# Patient Record
Sex: Female | Born: 2000 | Race: White | Hispanic: No | Marital: Single | Smoking: Never smoker
Health system: Southern US, Community
[De-identification: ages and names within clinical notes are randomized; demographics above are authoritative.]

## PROBLEM LIST (undated history)

## (undated) DIAGNOSIS — F509 Eating disorder, unspecified: Secondary | ICD-10-CM

## (undated) DIAGNOSIS — T50901A Poisoning by unspecified drugs, medicaments and biological substances, accidental (unintentional), initial encounter: Secondary | ICD-10-CM

## (undated) DIAGNOSIS — G43909 Migraine, unspecified, not intractable, without status migrainosus: Secondary | ICD-10-CM

## (undated) HISTORY — PX: WRIST FRACTURE SURGERY: SHX121

---
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
## 2019-01-19 IMAGING — DX DG ABDOMEN 1V
2 series · 2 of 2 positions shown · non-contrast
Comparison: None.

CLINICAL DATA: Acute right-sided low back pain without sciatica.

EXAM:
ABDOMEN - 1 VIEW

[abdomen kub (1 of 2)]
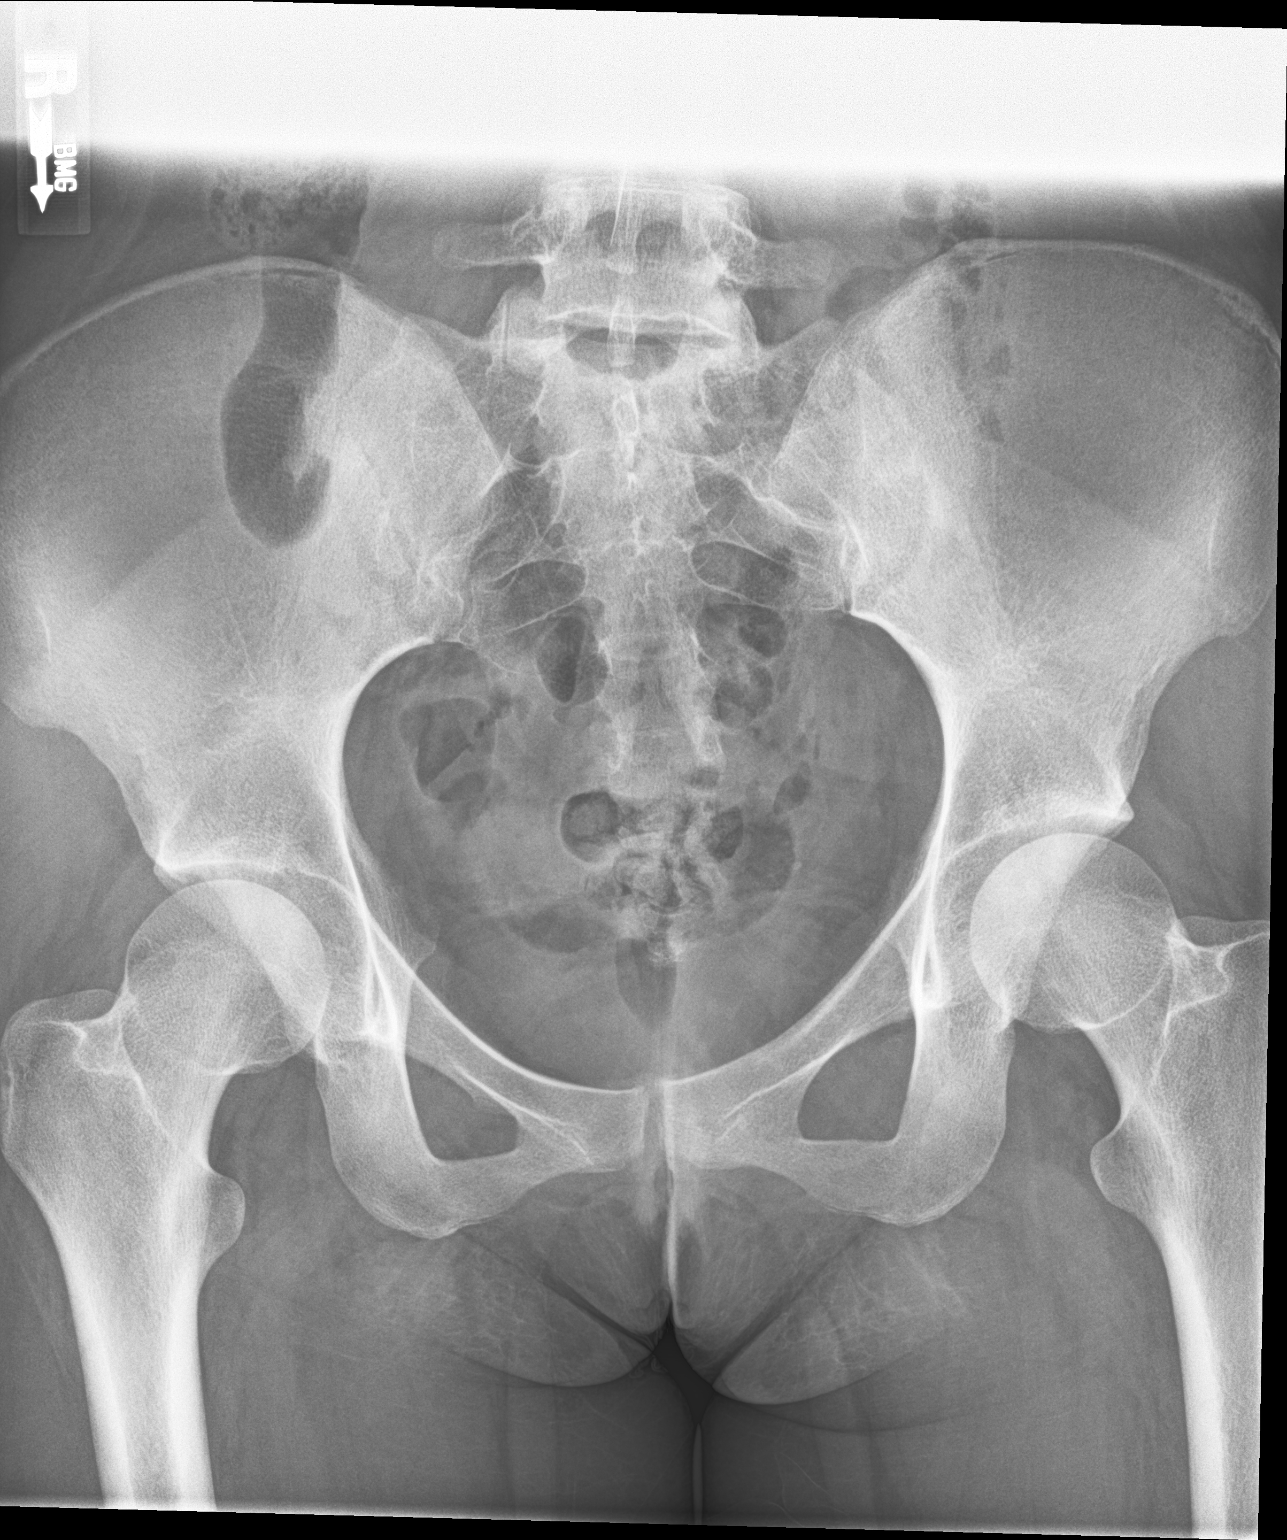

[abdomen kub (2 of 2)]
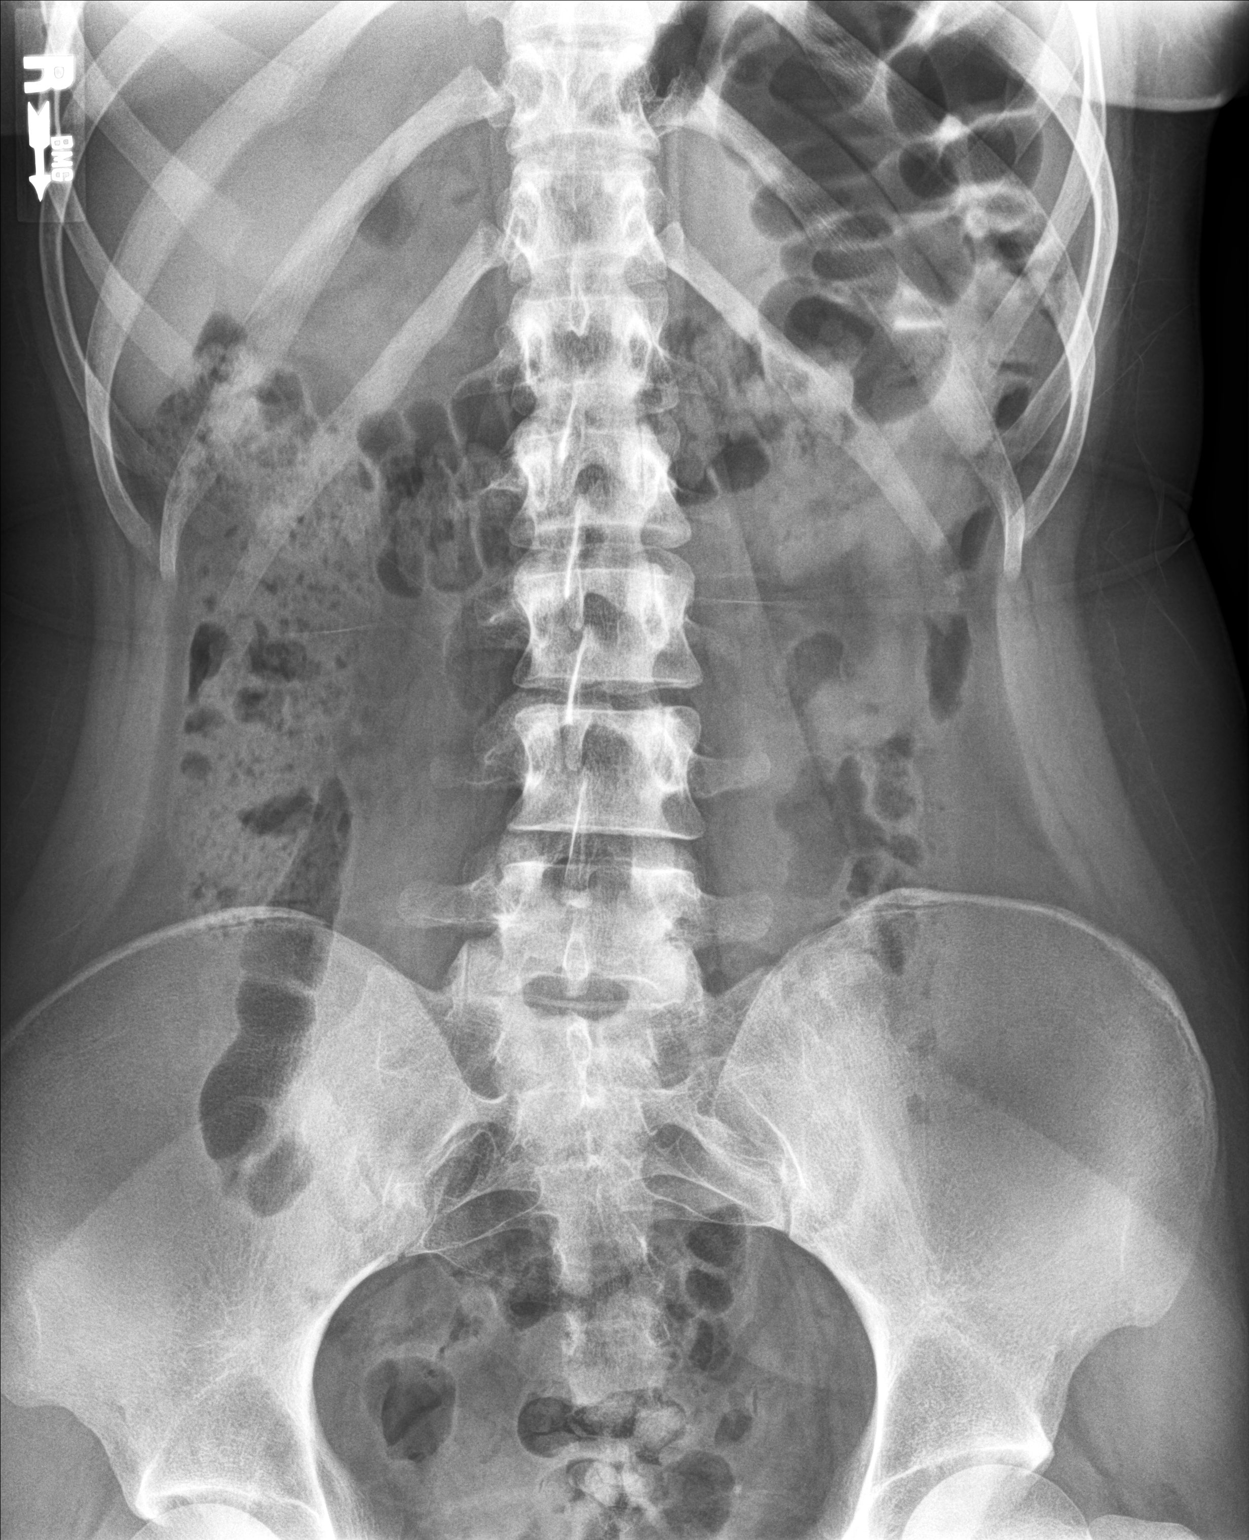

[2 of 2 positions shown; findings below may reference images not displayed]

FINDINGS: The bowel gas pattern is normal. No radio-opaque calculi or other
significant radiographic abnormality are seen.
IMPRESSION: No evidence of bowel obstruction or ileus. No definite evidence of
nephrolithiasis.

## 2019-05-28 DIAGNOSIS — F901 Attention-deficit hyperactivity disorder, predominantly hyperactive type: Secondary | ICD-10-CM | POA: Diagnosis not present

## 2019-05-28 DIAGNOSIS — F319 Bipolar disorder, unspecified: Secondary | ICD-10-CM | POA: Diagnosis not present

## 2019-07-01 DIAGNOSIS — F319 Bipolar disorder, unspecified: Secondary | ICD-10-CM | POA: Diagnosis not present

## 2019-08-12 DIAGNOSIS — F319 Bipolar disorder, unspecified: Secondary | ICD-10-CM | POA: Diagnosis not present

## 2019-08-12 DIAGNOSIS — F902 Attention-deficit hyperactivity disorder, combined type: Secondary | ICD-10-CM | POA: Diagnosis not present

## 2019-09-16 DIAGNOSIS — F902 Attention-deficit hyperactivity disorder, combined type: Secondary | ICD-10-CM | POA: Diagnosis not present

## 2019-09-16 DIAGNOSIS — F319 Bipolar disorder, unspecified: Secondary | ICD-10-CM | POA: Diagnosis not present

## 2019-10-27 DIAGNOSIS — F902 Attention-deficit hyperactivity disorder, combined type: Secondary | ICD-10-CM | POA: Diagnosis not present

## 2019-10-27 DIAGNOSIS — F319 Bipolar disorder, unspecified: Secondary | ICD-10-CM | POA: Diagnosis not present

## 2019-11-04 ENCOUNTER — Emergency Department (HOSPITAL_COMMUNITY)
Admission: EM | Admit: 2019-11-04 | Discharge: 2019-11-04 | Disposition: A | Discharge status: Home / Self Care | Payer: Federal, State, Local not specified - PPO

## 2019-11-04 ENCOUNTER — Other Ambulatory Visit: Payer: Self-pay

## 2019-12-02 DIAGNOSIS — F319 Bipolar disorder, unspecified: Secondary | ICD-10-CM | POA: Diagnosis not present

## 2019-12-02 DIAGNOSIS — F902 Attention-deficit hyperactivity disorder, combined type: Secondary | ICD-10-CM | POA: Diagnosis not present

## 2019-12-16 DIAGNOSIS — F319 Bipolar disorder, unspecified: Secondary | ICD-10-CM | POA: Diagnosis not present

## 2019-12-23 DIAGNOSIS — F319 Bipolar disorder, unspecified: Secondary | ICD-10-CM | POA: Diagnosis not present

## 2019-12-26 DIAGNOSIS — Z20822 Contact with and (suspected) exposure to covid-19: Secondary | ICD-10-CM | POA: Diagnosis not present

## 2019-12-26 DIAGNOSIS — J039 Acute tonsillitis, unspecified: Secondary | ICD-10-CM | POA: Diagnosis not present

## 2019-12-26 DIAGNOSIS — J029 Acute pharyngitis, unspecified: Secondary | ICD-10-CM | POA: Diagnosis not present

## 2019-12-26 DIAGNOSIS — R509 Fever, unspecified: Secondary | ICD-10-CM | POA: Diagnosis not present

## 2020-01-08 DIAGNOSIS — F902 Attention-deficit hyperactivity disorder, combined type: Secondary | ICD-10-CM | POA: Diagnosis not present

## 2020-01-08 DIAGNOSIS — F319 Bipolar disorder, unspecified: Secondary | ICD-10-CM | POA: Diagnosis not present

## 2020-02-26 DIAGNOSIS — F902 Attention-deficit hyperactivity disorder, combined type: Secondary | ICD-10-CM | POA: Diagnosis not present

## 2020-02-26 DIAGNOSIS — F319 Bipolar disorder, unspecified: Secondary | ICD-10-CM | POA: Diagnosis not present

## 2020-03-24 DIAGNOSIS — F319 Bipolar disorder, unspecified: Secondary | ICD-10-CM | POA: Diagnosis not present

## 2020-03-24 DIAGNOSIS — F902 Attention-deficit hyperactivity disorder, combined type: Secondary | ICD-10-CM | POA: Diagnosis not present

## 2020-07-04 ENCOUNTER — Encounter (HOSPITAL_COMMUNITY): Payer: Self-pay

## 2020-07-04 ENCOUNTER — Emergency Department (HOSPITAL_COMMUNITY)
Admission: EM | Admit: 2020-07-04 | Discharge: 2020-07-04 | Disposition: A | Discharge status: Home / Self Care | Payer: Federal, State, Local not specified - PPO | Attending: Emergency Medicine | Admitting: Emergency Medicine

## 2020-07-04 DIAGNOSIS — R197 Diarrhea, unspecified: Secondary | ICD-10-CM | POA: Diagnosis not present

## 2020-07-04 DIAGNOSIS — R509 Fever, unspecified: Secondary | ICD-10-CM | POA: Diagnosis not present

## 2020-07-04 DIAGNOSIS — R109 Unspecified abdominal pain: Secondary | ICD-10-CM | POA: Insufficient documentation

## 2020-07-04 DIAGNOSIS — R112 Nausea with vomiting, unspecified: Secondary | ICD-10-CM | POA: Diagnosis present

## 2020-07-04 DIAGNOSIS — E860 Dehydration: Secondary | ICD-10-CM

## 2020-07-04 LAB — CBC WITH DIFFERENTIAL/PLATELET
Abs Immature Granulocytes: 0.06 10*3/uL (ref 0.00–0.07)
Basophils Absolute: 0 10*3/uL (ref 0.0–0.1)
Basophils Relative: 0 %
Eosinophils Absolute: 0 10*3/uL (ref 0.0–0.5)
Eosinophils Relative: 0 %
HCT: 45.4 % (ref 36.0–46.0)
Hemoglobin: 15.4 g/dL — ABNORMAL HIGH (ref 12.0–15.0)
Immature Granulocytes: 0 %
Lymphocytes Relative: 6 %
Lymphs Abs: 0.8 10*3/uL (ref 0.7–4.0)
MCH: 31.2 pg (ref 26.0–34.0)
MCHC: 33.9 g/dL (ref 30.0–36.0)
MCV: 92.1 fL (ref 80.0–100.0)
Monocytes Absolute: 0.8 10*3/uL (ref 0.1–1.0)
Monocytes Relative: 6 %
Neutro Abs: 12.8 10*3/uL — ABNORMAL HIGH (ref 1.7–7.7)
Neutrophils Relative %: 88 %
Platelets: 321 10*3/uL (ref 150–400)
RBC: 4.93 MIL/uL (ref 3.87–5.11)
RDW: 12 % (ref 11.5–15.5)
WBC: 14.5 10*3/uL — ABNORMAL HIGH (ref 4.0–10.5)
nRBC: 0 % (ref 0.0–0.2)

## 2020-07-04 LAB — COMPREHENSIVE METABOLIC PANEL
ALT: 9 U/L (ref 0–44)
AST: 14 U/L — ABNORMAL LOW (ref 15–41)
Albumin: 5 g/dL (ref 3.5–5.0)
Alkaline Phosphatase: 38 U/L (ref 38–126)
Anion gap: 14 (ref 5–15)
BUN: 17 mg/dL (ref 6–20)
CO2: 20 mmol/L — ABNORMAL LOW (ref 22–32)
Calcium: 10 mg/dL (ref 8.9–10.3)
Chloride: 105 mmol/L (ref 98–111)
Creatinine, Ser: 0.69 mg/dL (ref 0.44–1.00)
GFR, Estimated: >60 mL/min (ref >60)
Glucose, Bld: 101 mg/dL — ABNORMAL HIGH (ref 70–99)
Potassium: 3.5 mmol/L (ref 3.5–5.1)
Sodium: 139 mmol/L (ref 135–145)
Total Bilirubin: 1.2 mg/dL (ref 0.3–1.2)
Total Protein: 8.1 g/dL (ref 6.5–8.1)

## 2020-07-04 LAB — URINALYSIS, ROUTINE W REFLEX MICROSCOPIC
Bilirubin Urine: NEGATIVE
Glucose, UA: NEGATIVE mg/dL
Hgb urine dipstick: NEGATIVE
Ketones, ur: 80 mg/dL — AB
Leukocytes,Ua: NEGATIVE
Nitrite: NEGATIVE
Protein, ur: NEGATIVE mg/dL
Specific Gravity, Urine: 1.024 (ref 1.005–1.030)
pH: 5 (ref 5.0–8.0)

## 2020-07-04 LAB — I-STAT BETA HCG BLOOD, ED (MC, WL, AP ONLY): I-stat hCG, quantitative: <5 m[IU]/mL (ref <5)

## 2020-07-04 LAB — LIPASE, BLOOD: Lipase: 25 U/L (ref 11–51)

## 2020-07-04 MED ORDER — LOPERAMIDE HCL 2 MG PO CAPS
4.0000 mg | ORAL_CAPSULE | Freq: Once | ORAL | Status: AC
  Administered 2020-07-04: 4 mg via ORAL
  Filled 2020-07-04: qty 2

## 2020-07-04 MED ORDER — SODIUM CHLORIDE 0.9 % IV BOLUS
1000.0000 mL | Freq: Once | INTRAVENOUS | Status: AC
  Administered 2020-07-04: 1000 mL via INTRAVENOUS

## 2020-07-04 MED ORDER — LACTATED RINGERS IV BOLUS
1000.0000 mL | Freq: Once | INTRAVENOUS | Status: AC
  Administered 2020-07-04: 1000 mL via INTRAVENOUS

## 2020-07-04 MED ORDER — METOCLOPRAMIDE HCL 10 MG PO TABS: 10.0000 mg | ORAL_TABLET | Freq: Four times a day (QID) | ORAL | 0 refills | Status: AC

## 2020-07-04 MED ORDER — METOCLOPRAMIDE HCL 5 MG/ML IJ SOLN
10.0000 mg | Freq: Once | INTRAMUSCULAR | Status: AC
  Administered 2020-07-04: 10 mg via INTRAVENOUS
  Filled 2020-07-04: qty 2

## 2020-07-04 MED ORDER — ONDANSETRON HCL 4 MG/2ML IJ SOLN
4.0000 mg | Freq: Once | INTRAMUSCULAR | Status: AC
  Administered 2020-07-04: 4 mg via INTRAVENOUS
  Filled 2020-07-04: qty 2

## 2020-07-04 NOTE — ED Provider Notes (Signed)
[facility]-EMERGENCY DEPT Provider Note   CSN: [id number] Arrival date & time: 07/04/20  1443     History Chief Complaint  Patient presents with  . Emesis    Rhonda Casey is a 20 y.o. female with a history of bulimia nervosa, generalized anxiety disorder who presents the emergency department with a chief complaint of vomiting.  The patient endorses countless episodes of nonbloody, nonbilious vomiting, nonbloody diarrhea, and nausea, onset at noon.  She has been unable to tolerate any fluids, including sips of water.  She reports that she has voided approximately 3 times in the last 24 hours.  Family reports tactile fever earlier tonight.  She denies chills, dysuria, hematuria, vaginal bleeding, vaginal discharge, back pain, abdominal pain, chest pain, shortness of breath.  She adamantly denies any self-induced vomiting, binging, or purging.  LMP was 06/26/2018.  She is sexually active.  No concerns for pregnancy at this time.  She has intermittently had right flank pain, but notes that she has had similar pains once every few months previously.  Although she has had vomiting today, she does report that she has been vomiting almost daily for several months.  Reports that sometimes she will take out her retainer and it would cause her to vomit.  Sometimes she will smell food and it may bring it on.  She does not recall having been seen and evaluated by GI for this.  She does state that she feels that her symptoms really began about a year ago after she had a similar illness with vomiting and diarrhea.  Her mother has a history of celiac disease and she has been tested multiple times, but test of been negative.  No history of abdominal surgery.    The history is provided by the patient and medical records. No language interpreter was used.       Past Medical History:  Diagnosis Date  . Eating disorder   . Migraine   . Overdose    November 2014,  hospitalized at [facility]    Patient Active Problem List   Diagnosis Date Noted  . Bulimia nervosa 09/22/2013  . Generalized anxiety disorder 07/23/2012  . Migraine with aura and without status migrainosus, not intractable 07/23/2012  . MDD (major depressive disorder), recurrent episode, severe (HCC) 07/23/2012  . Tension headache 07/23/2012    Past Surgical History:  Procedure Laterality Date  . WRIST FRACTURE SURGERY       OB History   No obstetric history on file.     Family History  Problem Relation Age of Onset  . Migraines Paternal Grandfather   . Autism Brother   . Celiac disease Mother     Social History   Tobacco Use  . Smoking status: Never Smoker  . Smokeless tobacco: Never Used  Substance Use Topics  . Alcohol use: No  . Drug use: No    Home Medications Prior to Admission medications   Medication Sig Start Date End Date Taking? Authorizing Provider  metoCLOPramide (REGLAN) 10 MG tablet Take 1 tablet (10 mg total) by mouth every 6 (six) hours. 07/04/20  Yes McDonald, Mia A, PA-C  lamoTRIgine (LAMICTAL) 100 MG tablet Take 1 tablet (100 mg total) by mouth at bedtime. 09/30/13   Chauncey Mann, MD  ondansetron (ZOFRAN ODT) 4 MG disintegrating tablet Take 1 tablet (4 mg total) by mouth every 8 (eight) hours as needed for nausea or vomiting. 07/02/18   Shade Flood, MD  sertraline (ZOLOFT) 100  MG tablet Take 1 tablet (100 mg total) by mouth daily. 09/30/13   Kendrick Fries, NP  traZODone (DESYREL) 100 MG tablet Take 100 mg by mouth at bedtime.    [provider]    Allergies    Patient has no known allergies.  Review of Systems   Review of Systems  Constitutional: Positive for fever. Negative for activity change, chills, diaphoresis and fatigue.  Respiratory: Negative for cough, shortness of breath and wheezing.   Cardiovascular: Negative for chest pain.  Gastrointestinal: Positive for diarrhea, nausea and vomiting. Negative for  abdominal pain and constipation.  Genitourinary: Positive for flank pain. Negative for dysuria, genital sores, hematuria, menstrual problem, urgency, vaginal bleeding, vaginal discharge and vaginal pain.  Musculoskeletal: Negative for back pain, myalgias and neck pain.  Skin: Negative for rash.  Allergic/Immunologic: Negative for immunocompromised state.  Neurological: Negative for syncope, weakness, numbness and headaches.  Psychiatric/Behavioral: Negative for confusion.    Physical Exam Updated Vital Signs BP 93/60   Pulse (!) 107   Temp 98.6 F (37 C)   Resp 17   Ht 5\' 9"  (1.753 m)   Wt 67.1 kg   LMP 06/25/2020 (Exact Date)   SpO2 100%   BMI 21.86 kg/m   Physical Exam Vitals and nursing note reviewed.  Constitutional:      General: She is not in acute distress.    Appearance: She is not ill-appearing, toxic-appearing or diaphoretic.  HENT:     Head: Normocephalic.     Mouth/Throat:     Mouth: Mucous membranes are dry.  Eyes:     Conjunctiva/sclera: Conjunctivae normal.  Cardiovascular:     Rate and Rhythm: Normal rate and regular rhythm.     Heart sounds: No murmur heard. No friction rub. No gallop.   Pulmonary:     Effort: Pulmonary effort is normal. No respiratory distress.     Breath sounds: No stridor. No wheezing, rhonchi or rales.  Chest:     Chest wall: No tenderness.  Abdominal:     General: There is no distension.     Palpations: Abdomen is soft.     Comments: Mild right CVA tenderness.  No left CVA tenderness.  Abdomen soft, nontender, nondistended.  Hyperactive bowel sounds in all 4 quadrants.  No tenderness of McBurney's point.  Negative Murphy sign.  No rebound or guarding.  Musculoskeletal:     Cervical back: Neck supple.  Skin:    General: Skin is warm.     Capillary Refill: Capillary refill takes 2 to 3 seconds.     Coloration: Skin is pale. Skin is not jaundiced.     Findings: No bruising, lesion or rash.  Neurological:     General: No focal  deficit present.     Mental Status: She is alert.  Psychiatric:        Behavior: Behavior normal.     ED Results / Procedures / Treatments   Labs (all labs ordered are listed, but only abnormal results are displayed) Labs Reviewed  CBC WITH DIFFERENTIAL/PLATELET - Abnormal; Notable for the following components:      Result Value   WBC 14.5 (*)    Hemoglobin 15.4 (*)    Neutro Abs 12.8 (*)    All other components within normal limits  COMPREHENSIVE METABOLIC PANEL - Abnormal; Notable for the following components:   CO2 20 (*)    Glucose, Bld 101 (*)    AST 14 (*)    All other components within normal limits  URINALYSIS, ROUTINE W REFLEX MICROSCOPIC - Abnormal; Notable for the following components:   Ketones, ur 80 (*)    All other components within normal limits  LIPASE, BLOOD  I-STAT BETA HCG BLOOD, ED ([facility], [facility], [facility] ONLY)    EKG None  Radiology No results found.  Procedures Procedures   Medications Ordered in ED Medications  sodium chloride 0.9 % bolus 1,000 mL (0 mLs Intravenous Stopped 07/04/20 0449)  ondansetron (ZOFRAN) injection 4 mg (4 mg Intravenous Given 07/04/20 0400)  lactated ringers bolus 1,000 mL (0 mLs Intravenous Stopped 07/04/20 0626)  metoCLOPramide (REGLAN) injection 10 mg (10 mg Intravenous Given 07/04/20 0502)  loperamide (IMODIUM) capsule 4 mg (4 mg Oral Given 07/04/20 [id number])    ED Course  I have reviewed the triage vital signs and the nursing notes.  Pertinent labs & imaging results that were available during my care of the patient were reviewed by me and considered in my medical decision making (see chart for details).    MDM Rules/Calculators/A&P                          20 year old female with a history of bulimia nervosa, generalized anxiety disorder brought to the emergency department by EMS after developing nausea, vomiting, diarrhea less than 24 hours ago.  She has also had some right flank pain, which she says that she has previously had  intermittently for many months that she has noticed to be correlated with vomiting as well as a tactile fever.  Tachycardic on arrival.  Vital signs are otherwise stable.  On exam, the patient appears dehydrated with dry mucous membranes and increased capillary refill.  She does have mild tenderness palpation of the right CVA region on exam, but no other focal abdominal findings.  Labs have been reviewed and independently interpreted by me.  She does have a mild leukocytosis, but I suspect that this may be secondary to hemoconcentration giving significant volume losses patient does appear hypovolemic on exam.  Bicarb is 20, likely secondary to mild dehydration.  No metabolic derangements.  UA with 80 ketonuria.  No hemoglobinuria or concern for infection.  Given onset of symptoms less than 24 hours, suspect viral etiology.  I also consider the following on her differential diagnosis including bowel obstruction, diverticulitis, C. difficile, appendicitis, pyelonephritis, obstructive uropathy, UTI, ectopic pregnancy, PID, pancreatitis, cholecystitis, or peptic ulcer disease.  Patient was given 2 L of IV fluids.  She initially had continued nausea after Zofran and following Reglan administration, nausea had resolved.  She was able to be successfully fluid challenge.  She reported that she was feeling markedly improved and feels ready to go home.  She also appears to have much more color in her face and appears less pallorous.  We will discharge home with Reglan.  Given history of chronic GI issues, will also give a referral to GI.  All questions answered.  She is hemodynamically stable no acute distress.  Safe for discharge to home with outpatient follow-up as indicated.  Final Clinical Impression(s) / ED Diagnoses Final diagnoses:  Nausea vomiting and diarrhea  Dehydration    Rx / DC Orders ED Discharge Orders         Ordered    metoCLOPramide (REGLAN) 10 MG tablet  Every 6 hours         07/04/20 0627           Barkley BoardsMcDonald, Mia A, PA-C 07/04/20 0815    Charlynne PanderYao, David Hsienta,  MD 07/04/20 2253

## 2020-07-04 NOTE — ED Triage Notes (Addendum)
Pt presents from home via [facility], c/o n/v/d starting yesterday morning and R sided flank pain starting at approx 10pm. Pt states she has felt feverish as well   EMS vitals: HR 112, BP 130/70, 98% RA, 20 RR

## 2020-07-04 NOTE — Discharge Instructions (Addendum)
Thank you for allowing me to care for you today in the Emergency Department.   You were seen today for nausea vomiting and diarrhea.  Your labs were consistent with dehydration, but were otherwise reassuring.  Your nausea resolved with Reglan.  It was a new home with a short course of this medication.  You can take 1 tablet once every 6 hours as needed for nausea or vomiting.  Imodium is available over-the-counter.  Use as prescribed on the label.  Since you have struggled with vomiting for more than a year, I would recommend following up with gastroenterology.  I have provided you with a referral above.  Return the emergency department if you pass out, if you stop producing urine, if you have uncontrollable vomiting despite taking Reglan, or develop other new, concerning symptoms.
